# Patient Record
Sex: Male | Born: 1985 | Race: White | Hispanic: No | Marital: Married | Smoking: Former smoker
Health system: Southern US, Community
[De-identification: ages and names within clinical notes are randomized; demographics above are authoritative.]

## PROBLEM LIST (undated history)

## (undated) HISTORY — PX: HERNIA REPAIR: SHX51

## (undated) HISTORY — PX: OTHER SURGICAL HISTORY: SHX169

---

## 2017-09-13 DIAGNOSIS — F4325 Adjustment disorder with mixed disturbance of emotions and conduct: Secondary | ICD-10-CM | POA: Diagnosis not present

## 2017-09-20 DIAGNOSIS — F4325 Adjustment disorder with mixed disturbance of emotions and conduct: Secondary | ICD-10-CM | POA: Diagnosis not present

## 2018-11-11 NOTE — Progress Notes (Signed)
Cardiology Office Note:   Date:  11/12/2018  NAME:  Paul Ellis    MRN: [id number] DOB:  Mar 06, 1985   PCP:  Patient, No Pcp Per  Cardiologist:  Evalina Field, MD   Chief Complaint  Patient presents with   Chest Pain   History of Present Illness:   Paul Ellis is a 33 y.o. male with a hx of family history of CAD who is being seen today for the evaluation of chest pain.  He has no significant past medical history but does report a family history of CAD.  He had a grandfather who died at age 4 of a myocardial infarction.  He reports about 1 month ago, he was awoken from sleep with back pain as well as chest pain.  He reports this pain did improve somewhat with manipulation of his spine.  However, the chest pain was alarming.  He also reports the chest pain was alarming for his wife.  Associated symptoms include difficulty catching his breath when he had the pain.  He denies any fevers chills or other symptoms.  He reports no palpitations.  The pain appears to have started in the back and was radiating into the front of the chest wall.  He reports episodes similar to this in the past.  He reports that the pain in his back was similar in of very worrisome quality.  The pain at that time did go into his jaw, and he was evaluated in the emergency room.  He had an EKG and evaluation at that time was largely unremarkable.  He reports he is relatively sedentary and does not exercise regularly.  He smoked cigarettes in the past but is quit nearly 15 years ago.  He smoked for about 4 years.  He also reports that he does use smokeless tobacco daily.  He has done so for a number of years.  He consumes alcohol occasionally.  His brother has a history of severely high cholesterol, and he takes medications.  He was started on medications for this in his 6s.  His father also has cholesterol issues.  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  His wife is a pulmonologist with [employer], and they have 1  son together.  He takes no medications regularly.   Past Medical History: History reviewed. No pertinent past medical history.  Past Surgical History: Past Surgical History:  Procedure Laterality Date   HERNIA REPAIR     testicular torsion      Current Medications: No outpatient medications have been marked as taking for the 11/12/18 encounter (Office Visit) with Geralynn Rile, MD.     Allergies:    Patient has no known allergies.   Social History: Social History   Socioeconomic History   Marital status: Married    Spouse name: Not on file   Number of children: Not on file   Years of education: Not on file   Highest education level: Not on file  Occupational History   Not on file  Social Needs   Financial resource strain: Not on file   Food insecurity    Worry: Not on file    Inability: Not on file   Transportation needs    Medical: Not on file    Non-medical: Not on file  Tobacco Use   Smoking status: Former Smoker    Years: 8.00   Smokeless tobacco: Never Used  Substance and Sexual Activity   Alcohol use: Not Currently   Drug  use: Never   Sexual activity: Not on file  Lifestyle   Physical activity    Days per week: Not on file    Minutes per session: Not on file   Stress: Not on file  Relationships   Social connections    Talks on phone: Not on file    Gets together: Not on file    Attends religious service: Not on file    Active member of club or organization: Not on file    Attends meetings of clubs or organizations: Not on file    Relationship status: Not on file  Other Topics Concern   Not on file  Social History Narrative   Not on file     Family History: The patient's family history includes Heart attack (age of onset: 27) in his paternal grandfather; Heart disease in his father; Hyperlipidemia in his brother and father.  ROS:   All other ROS reviewed and negative. Pertinent positives noted in the HPI.      EKGs/Labs/Other Studies Reviewed:   The following studies were personally reviewed by me today:  EKG:  EKG is ordered today.  The ekg ordered today demonstrates normal sinus rhythm, heart rate 73, normal intervals, no evidence of ischemia or prior infarction, normal EKG, and was personally reviewed by me.   Recent Labs: No results found for requested labs within last 8760 hours.   Recent Lipid Panel No results found for: CHOL, TRIG, HDL, CHOLHDL, VLDL, LDLCALC, LDLDIRECT  Physical Exam:   VS:  BP 120/83    Pulse 76    Temp (!) 97.5 F (36.4 C)    Ht  (1.626 m)    Wt 149 lb 12.8 oz (67.9 kg)    SpO2 99%    BMI 25.71 kg/m    Wt Readings from Last 3 Encounters:  11/12/18 149 lb 12.8 oz (67.9 kg)    General: Well nourished, well developed, in no acute distress Heart: Atraumatic, normal size  Eyes: PEERLA, EOMI  Neck: Supple, no JVD Endocrine: No thryomegaly Cardiac: Normal S1, S2; RRR; no murmurs, rubs, or gallops Lungs: Clear to auscultation bilaterally, no wheezing, rhonchi or rales  Abd: Soft, nontender, no hepatomegaly  Ext: No edema, pulses 2+ Musculoskeletal: No deformities, BUE and BLE strength normal and equal Skin: Warm and dry, no rashes   Neuro: Alert and oriented to person, place, time, and situation, CNII-XII grossly intact, no focal deficits  Psych: Normal mood and affect   ASSESSMENT:   Paul Ellis is a 33 y.o. male who presents for the following: 1. Chest pain of uncertain etiology   2. Family history of early CAD   3. Precordial pain     PLAN:   1. Chest pain of uncertain etiology 2. Family history of early CAD 3. Precordial pain -He presents with atypical chest pain, that largely started in the back.  I do wonder if this is related to position and musculoskeletal issues.  He does have a significant family history of early coronary artery disease.  His brother also has possibly familial hypercholesterolemia.  His EKG today is very normal  without evidence of ischemia or infarction.  He has had the symptoms intermittently, and they are bothersome for him.  I think it is reasonable proceed with a coronary CTA to exclude obstructive CAD.  I think this will provide further reassurance for him and his wife.  He will take 50 mg metoprolol p.o. 1 hour prior to the scan.  We will  also obtain an echocardiogram to ensure his heart structurally normal.  I do not hear any worrisome murmurs on examination.  I will also check a lipid profile, CBC and CMP 1 week before his CT scan.  We can just do all of the labs together at that time and he will be fasting.  We will see him back in 3 months after this.   Disposition: Return in about 3 months (around 02/12/2019).  Medication Adjustments/Labs and Tests Ordered: Current medicines are reviewed at length with the patient today.  Concerns regarding medicines are outlined above.  Orders Placed This Encounter  Procedures   CT CORONARY MORPH W/CTA COR W/SCORE W/CA W/CM &/OR WO/CM   CT CORONARY FRACTIONAL FLOW RESERVE DATA PREP   CT CORONARY FRACTIONAL FLOW RESERVE FLUID ANALYSIS   Lipid panel   CBC   Comprehensive metabolic panel   EKG 12-Lead   ECHOCARDIOGRAM COMPLETE   Meds ordered this encounter  Medications   metoprolol tartrate (LOPRESSOR) 50 MG tablet    Sig: Take 1 tablet (50 mg total) by mouth once for 1 dose. TAKE TWO HOUR PRIOR TO  SCHEDULE CARDIAC TEST    Dispense:  1 tablet    Refill:  0    Patient Instructions  Medication Instructions:    SEE INSTRUCTION SHEET FOR CCTA  If you need a refill on your cardiac medications before your next appointment, please call your pharmacy*  Lab Work: LIPID, CMP, CBC If you have labs (blood work) drawn today and your tests are completely normal, you will receive your results only by:  MyChart Message (if you have MyChart) OR  A paper copy in the mail If you have any lab test that is abnormal or we need to change your treatment, we  will call you to review the results.  Testing/Procedures: WILL SCHEDULE AT [address] Your physician has requested that you have an echocardiogram. Echocardiography is a painless test that uses sound waves to create images of your heart. It provides your doctor with information about the size and shape of your heart and how well your hearts chambers and valves are working. This procedure takes approximately one hour. There are no restrictions for this procedure.   AND WILL SCHEDULE AT [place] - RADIOLOGY -ONCE APPROVAL IS OBTAINED FROM [facility] Your physician has requested that you have cardiac CTA. Cardiac computed tomography (CT) is a painless test that uses an x-ray machine to take clear, detailed pictures of your heart. For further information please visit [email or web address] Please follow instruction sheet as given.     Follow-Up: At [facility], you and your health needs are our priority.  As part of our continuing mission to provide you with exceptional heart care, we have created designated Provider Care Teams.  These Care Teams include your primary Cardiologist (physician) and Advanced Practice Providers (APPs -  Physician Assistants and Nurse Practitioners) who all work together to provide you with the care you need, when you need it.  Your next appointment:   2 MONTHS  The format for your next appointment:   In Person  Provider:   Lennie Odor, MD  Other Instructions   INSTRUCTIONS FOR  CORONARY CTA   Your cardiac CT will be scheduled at the below locations:   [facility] [address] [place], [place] [address] [phone]    If scheduled at [facility], please arrive at the [facility] main entrance of [facility] 30-45  minutes prior to test start time. Proceed to the [facility] Radiology Department (first floor) to check-in and test prep.     Please follow these instructions carefully  (unless otherwise directed):  PLEASE HAVE LABS DONE IN ONE WEEK PRIOR TO CORONARY CCTA.  On the Night Before the Test:  Be sure to Drink plenty of water.  Do not consume any caffeinated/decaffeinated beverages or chocolate 12 hours prior to your test.  Do not take any antihistamines 12 hours prior to your test.   On the Day of the Test:  Drink plenty of water. Do not drink any water within one hour of the test.  Do not eat any food 4 hours prior to the test.  You may take your regular medications prior to the test.   Take metoprolol (Lopressor)  50  MG  two hours prior to test.        After the Test:  Drink plenty of water.  After receiving IV contrast, you may experience a mild flushed feeling. This is normal.  On occasion, you may experience a mild rash up to 24 hours after the test. This is not dangerous. If this occurs, you can take Benadryl 25 mg and increase your fluid intake.  If you experience trouble breathing, this can be serious. If it is severe call 911 IMMEDIATELY. If it is mild, please call our office.  If you take any of these medications: Glipizide/Metformin, Avandament, Glucavance, please do not take 48 hours after completing test unless otherwise instructed.    Please contact the cardiac imaging nurse navigator should you have any questions/concerns Rockwell AlexandriaSara Wallace, RN Navigator Cardiac Imaging [facility] [phone] Office           Signed, Lenna GilfordWesley T. Flora Lipps'Neal, MD [facility]   [facility]  [address] [place], [place] [address] [phone]  11/12/2018 11:25 AM

## 2018-11-12 ENCOUNTER — Encounter: Payer: Self-pay | Admitting: Cardiovascular Disease

## 2018-11-12 ENCOUNTER — Ambulatory Visit: Payer: BC Managed Care – PPO | Admitting: Cardiovascular Disease

## 2018-11-12 ENCOUNTER — Other Ambulatory Visit: Payer: Self-pay

## 2018-11-12 VITALS — BP 120/83 | HR 76 | Temp 97.5°F | Ht 64.0 in | Wt 149.8 lb

## 2018-11-12 DIAGNOSIS — R079 Chest pain, unspecified: Secondary | ICD-10-CM

## 2018-11-12 DIAGNOSIS — Z8249 Family history of ischemic heart disease and other diseases of the circulatory system: Secondary | ICD-10-CM | POA: Diagnosis not present

## 2018-11-12 DIAGNOSIS — R072 Precordial pain: Secondary | ICD-10-CM | POA: Diagnosis not present

## 2018-11-12 MED ORDER — METOPROLOL TARTRATE 50 MG PO TABS: 50.0000 mg | ORAL_TABLET | Freq: Once | ORAL | 0 refills | Status: AC

## 2018-11-12 NOTE — Patient Instructions (Addendum)
Medication Instructions:    SEE INSTRUCTION SHEET FOR CCTA  If you need a refill on your cardiac medications before your next appointment, please call your pharmacy*  Lab Work: LIPID, CMP, CBC If you have labs (blood work) drawn today and your tests are completely normal, you will receive your results only by: Marland Kitchen MyChart Message (if you have MyChart) OR . A paper copy in the mail If you have any lab test that is abnormal or we need to change your treatment, we will call you to review the results.  Testing/Procedures: WILL SCHEDULE AT [place] Your physician has requested that you have an echocardiogram. Echocardiography is a painless test that uses sound waves to create images of your heart. It provides your doctor with information about the size and shape of your heart and how well your heart's chambers and valves are working. This procedure takes approximately one hour. There are no restrictions for this procedure.   AND WILL SCHEDULE AT [place] FROM [facility] Your physician has requested that you have cardiac CTA. Cardiac computed tomography (CT) is a painless test that uses an x-ray machine to take clear, detailed pictures of your heart. For further information please visit [email or web address]. Please follow instruction sheet as given.     Follow-Up: At [facility], you and your health needs are our priority.  As part of our continuing mission to provide you with exceptional heart care, we have created designated Provider Care Teams.  These Care Teams include your primary Cardiologist (physician) and Advanced Practice Providers (APPs -  Physician Assistants and Nurse Practitioners) who all work together to provide you with the care you need, when you need it.  Your next appointment:   2 MONTHS  The format for your next appointment:   In Person  Provider:   Eleonore Chiquito, MD  Other  Instructions   INSTRUCTIONS FOR  CORONARY CTA   Your cardiac CT will be scheduled at the below locations:   [facility] [address] [place], [place] [address] [phone]    If scheduled at [facility], please arrive at the [facility] main entrance of [facility] 30-45 minutes prior to test start time. Proceed to the [facility] Radiology Department (first floor) to check-in and test prep.     Please follow these instructions carefully (unless otherwise directed):  PLEASE HAVE LABS DONE IN ONE WEEK PRIOR TO CORONARY CCTA.  On the Night Before the Test: . Be sure to Drink plenty of water. . Do not consume any caffeinated/decaffeinated beverages or chocolate 12 hours prior to your test. . Do not take any antihistamines 12 hours prior to your test.   On the Day of the Test: . Drink plenty of water. Do not drink any water within one hour of the test. . Do not eat any food 4 hours prior to the test. . You may take your regular medications prior to the test.  . Take metoprolol (Lopressor)  50  MG  two hours prior to test.        After the Test: . Drink plenty of water. . After receiving IV contrast, you may experience a mild flushed feeling. This is normal. . On occasion, you may experience a mild rash up to 24 hours after the test. This is not dangerous. If this occurs, you can take Benadryl 25 mg and increase your fluid intake. . If you experience  trouble breathing, this can be serious. If it is severe call 911 IMMEDIATELY. If it is mild, please call our office. . If you take any of these medications: Glipizide/Metformin, Avandament, Glucavance, please do not take 48 hours after completing test unless otherwise instructed.    Please contact the cardiac imaging nurse navigator should you have any questions/concerns Rockwell Alexandria, RN Navigator Cardiac Imaging [facility] Heart and Vascular Services [phone] Office

## 2018-11-14 ENCOUNTER — Ambulatory Visit (HOSPITAL_COMMUNITY): Payer: BC Managed Care – PPO | Attending: Cardiology

## 2018-11-14 ENCOUNTER — Other Ambulatory Visit: Payer: Self-pay

## 2018-11-14 DIAGNOSIS — R079 Chest pain, unspecified: Secondary | ICD-10-CM | POA: Insufficient documentation

## 2018-11-14 DIAGNOSIS — Z8249 Family history of ischemic heart disease and other diseases of the circulatory system: Secondary | ICD-10-CM | POA: Diagnosis not present

## 2018-12-11 ENCOUNTER — Telehealth (HOSPITAL_COMMUNITY): Payer: Self-pay | Admitting: Emergency Medicine

## 2018-12-11 NOTE — Telephone Encounter (Signed)
Reaching out to patient to offer assistance regarding upcoming cardiac imaging study; pt verbalizes understanding of appt date/time, parking situation and where to check in, pre-test NPO status and medications ordered, and verified current allergies; name and call back number provided for further questions should they arise Keithan Dileonardo RN Navigator Cardiac Imaging [facility] [phone] office [phone] cell 

## 2018-12-12 ENCOUNTER — Ambulatory Visit (HOSPITAL_COMMUNITY)
Admission: RE | Admit: 2018-12-12 | Discharge: 2018-12-12 | Disposition: A | Discharge status: Home / Self Care | Payer: BC Managed Care – PPO | Source: Ambulatory Visit | Attending: Cardiovascular Disease | Admitting: Cardiovascular Disease

## 2018-12-12 ENCOUNTER — Encounter: Payer: Self-pay | Admitting: *Deleted

## 2018-12-12 ENCOUNTER — Other Ambulatory Visit: Payer: Self-pay

## 2018-12-12 DIAGNOSIS — R072 Precordial pain: Secondary | ICD-10-CM | POA: Insufficient documentation

## 2018-12-12 DIAGNOSIS — Z8249 Family history of ischemic heart disease and other diseases of the circulatory system: Secondary | ICD-10-CM | POA: Insufficient documentation

## 2018-12-12 DIAGNOSIS — Z006 Encounter for examination for normal comparison and control in clinical research program: Secondary | ICD-10-CM

## 2018-12-12 DIAGNOSIS — R079 Chest pain, unspecified: Secondary | ICD-10-CM | POA: Diagnosis not present

## 2018-12-12 LAB — CBC
HCT: 45.7 % (ref 39.0–52.0)
Hemoglobin: 15.2 g/dL (ref 13.0–17.0)
MCH: 30 pg (ref 26.0–34.0)
MCHC: 33.3 g/dL (ref 30.0–36.0)
MCV: 90.3 fL (ref 80.0–100.0)
Platelets: 276 10*3/uL (ref 150–400)
RBC: 5.06 MIL/uL (ref 4.22–5.81)
RDW: 12.9 % (ref 11.5–15.5)
WBC: 8.4 10*3/uL (ref 4.0–10.5)
nRBC: 0 % (ref 0.0–0.2)

## 2018-12-12 LAB — COMPREHENSIVE METABOLIC PANEL
ALT: 26 U/L (ref 0–44)
AST: 18 U/L (ref 15–41)
Albumin: 4.2 g/dL (ref 3.5–5.0)
Alkaline Phosphatase: 66 U/L (ref 38–126)
Anion gap: 9 (ref 5–15)
BUN: 11 mg/dL (ref 6–20)
CO2: 26 mmol/L (ref 22–32)
Calcium: 9.3 mg/dL (ref 8.9–10.3)
Chloride: 102 mmol/L (ref 98–111)
Creatinine, Ser: 1.18 mg/dL (ref 0.61–1.24)
GFR calc Af Amer: >60 mL/min (ref >60)
GFR calc non Af Amer: >60 mL/min (ref >60)
Glucose, Bld: 94 mg/dL (ref 70–99)
Potassium: 3.7 mmol/L (ref 3.5–5.1)
Sodium: 137 mmol/L (ref 135–145)
Total Bilirubin: 0.6 mg/dL (ref 0.3–1.2)
Total Protein: 6.8 g/dL (ref 6.5–8.1)

## 2018-12-12 LAB — LIPID PANEL
Cholesterol: 221 mg/dL — ABNORMAL HIGH (ref 0–200)
HDL: 33 mg/dL — ABNORMAL LOW (ref >40)
LDL Cholesterol: 128 mg/dL — ABNORMAL HIGH (ref 0–99)
Total CHOL/HDL Ratio: 6.7 RATIO
Triglycerides: 298 mg/dL — ABNORMAL HIGH (ref <150)
VLDL: 60 mg/dL — ABNORMAL HIGH (ref 0–40)

## 2018-12-12 LAB — POCT I-STAT CREATININE: Creatinine, Ser: 1.1 mg/dL (ref 0.61–1.24)

## 2018-12-12 IMAGING — CT CT HEART MORP W/ CTA COR W/ SCORE W/ CA W/CM &/OR W/O CM
4 of 7 series · 8 of 20 positions shown, 9 images · non-contrast
Comparison: None.
COMPARISON: None.

Addendum:
EXAM:
OVER-READ INTERPRETATION  CT CHEST

The following report is an over-read performed by radiologist Dr.
over-read does not include interpretation of cardiac or coronary
anatomy or pathology. The coronary CTA interpretation by the
cardiologist is attached.
CLINICAL DATA: 33-year-old male with atypical chest pain.
Cardiac/Coronary  CTA
TECHNIQUE: The patient was scanned on a Phillips Force scanner.

[Series 6: best diast 75 % · axial · 0.39mm/px · z∈[-158,-119]mm · 2 of 298 slices shown]
[im 100/298  vessel]
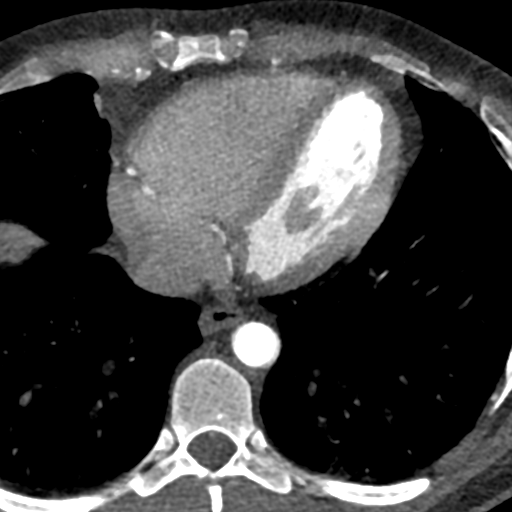
[im 199/298  vessel]
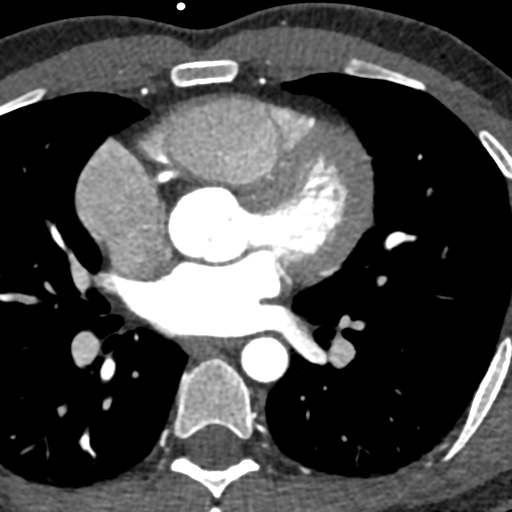

[Series 7: best syst · axial · 0.39mm/px · z∈[-158,-119]mm · 2 of 298 slices shown, 3 images]
[im 100/298  vessel]
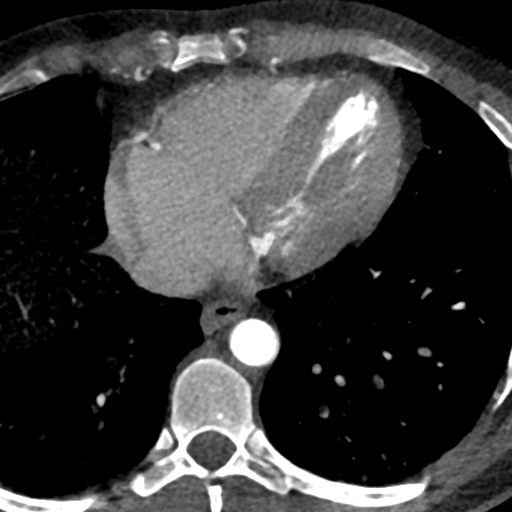
[im 100/298  lung]
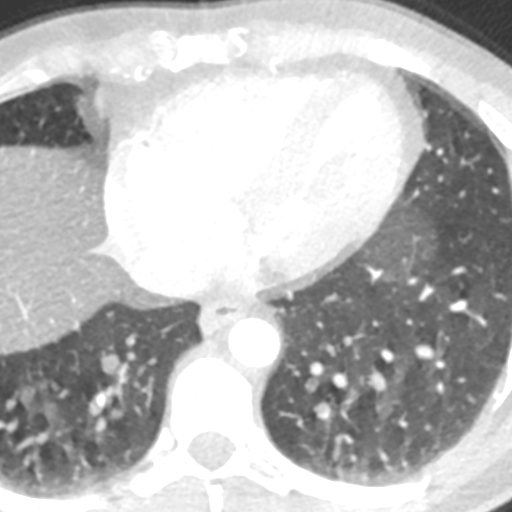
[im 199/298  vessel]
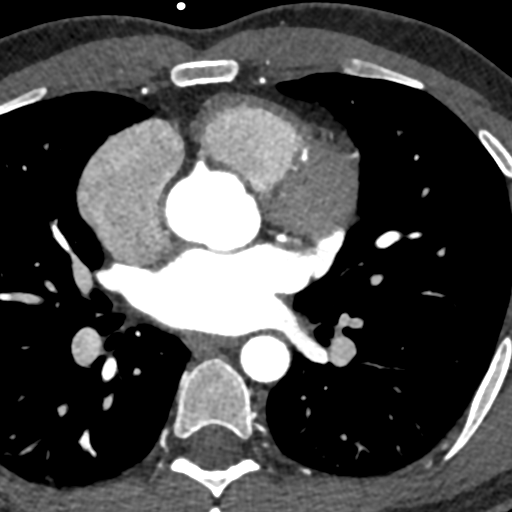

[Series 8: ts diast sharp 36 % · axial · 0.39mm/px · z∈[-158,-119]mm · 2 of 298 slices shown]
[im 100/298  lung]
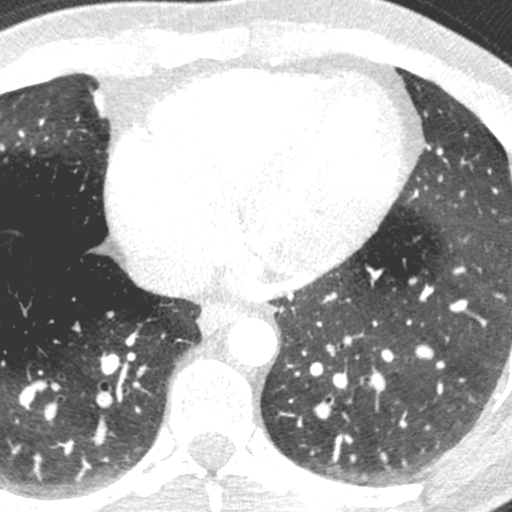
[im 199/298  lung]
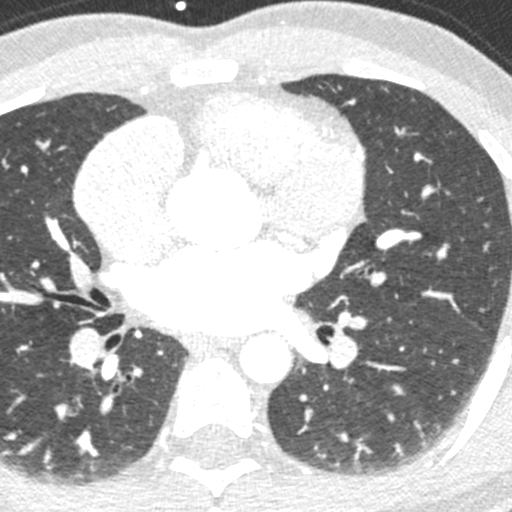

[Series 9: ts syst sharp · axial · 0.39mm/px · z∈[-158,-119]mm · 2 of 298 slices shown]
[im 100/298  lung]
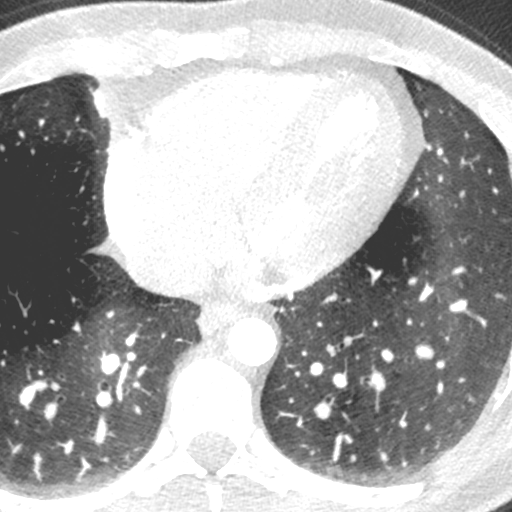
[im 199/298  lung]
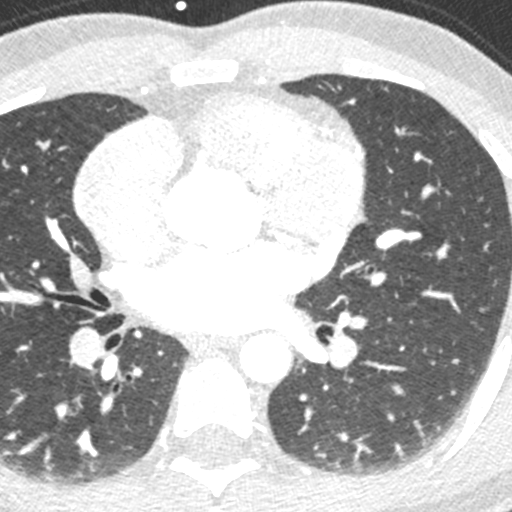

[8 of 20 positions shown; findings below may reference images not displayed]

FINDINGS: Vascular: Normal aortic caliber. No central pulmonary embolism, on
this non-dedicated study.

Mediastinum/Nodes: No imaged thoracic adenopathy.

Lungs/Pleura: No imaged pleural fluid.  Clear imaged lungs.

Upper Abdomen: Normal imaged portions of the liver, spleen, stomach.

Musculoskeletal: No acute osseous abnormality.
IMPRESSION: No acute findings in the imaged extracardiac chest.
FINDINGS: A 100 kV prospective scan was triggered in the descending thoracic
aorta at 111 HU's. Axial non-contrast 3 mm slices were carried out
through the heart. The data set was analyzed on a dedicated work
station and scored using the Agatson method. Gantry rotation speed
was 250 msecs and collimation was .6 mm. 50 mg of PO Metoprolol and
0.8 mg of sl NTG was given. The 3D data set was reconstructed in 5%
intervals of the 67-82 % of the R-R cycle. Diastolic phases were
analyzed on a dedicated work station using MPR, MIP and VRT modes.
The patient received 80 cc of contrast.

Aorta:  Normal size.  No calcifications.  No dissection.

Aortic Valve:  Trileaflet.  No calcifications.

Coronary Arteries:  Normal coronary origin.  Right dominance.

RCA is a large dominant artery that gives rise to PDA and PLA. There
is no plaque.

Left main is a large artery that gives rise to LAD and LCX arteries.
Left main has no plaque.

LAD is a large vessel that gives rise to a large diagonal artery and
has no plaque.

LCX is a non-dominant artery that gives rise to one large OM1
branch. There is no plaque.

Other findings:

Normal pulmonary vein drainage into the left atrium.

Normal left atrial appendage without a thrombus.

Normal size of the pulmonary artery.
IMPRESSION: 1. Coronary calcium score of 0. This was 0 percentile for age and
sex matched control.

2. Normal coronary origin with right dominance.

3. This study is affected by motion, however there is no CAD.
CAD-RADS 0. Consider non-atherosclerotic causes of chest pain.

*** End of Addendum ***
EXAM:
OVER-READ INTERPRETATION  CT CHEST

The following report is an over-read performed by radiologist Dr.
over-read does not include interpretation of cardiac or coronary
anatomy or pathology. The coronary CTA interpretation by the
cardiologist is attached.
FINDINGS: Vascular: Normal aortic caliber. No central pulmonary embolism, on
this non-dedicated study.

Mediastinum/Nodes: No imaged thoracic adenopathy.

Lungs/Pleura: No imaged pleural fluid.  Clear imaged lungs.

Upper Abdomen: Normal imaged portions of the liver, spleen, stomach.

Musculoskeletal: No acute osseous abnormality.
IMPRESSION: No acute findings in the imaged extracardiac chest.

## 2018-12-12 MED ORDER — NITROGLYCERIN 0.4 MG SL SUBL: 0.8000 mg | SUBLINGUAL_TABLET | SUBLINGUAL | Status: DC | PRN

## 2018-12-12 MED ORDER — METOPROLOL TARTRATE 5 MG/5ML IV SOLN: 5.0000 mg | INTRAVENOUS | Status: DC | PRN

## 2018-12-12 MED ORDER — METOPROLOL TARTRATE 5 MG/5ML IV SOLN
INTRAVENOUS | Status: AC
  Filled 2018-12-12: qty 15

## 2018-12-12 MED ORDER — NITROGLYCERIN 0.4 MG SL SUBL
SUBLINGUAL_TABLET | SUBLINGUAL | Status: AC
  Filled 2018-12-12: qty 1

## 2018-12-12 MED ORDER — IOHEXOL 350 MG/ML SOLN
80.0000 mL | Freq: Once | INTRAVENOUS | Status: AC | PRN
  Administered 2018-12-12: 80 mL via INTRAVENOUS

## 2018-12-15 ENCOUNTER — Telehealth: Payer: Self-pay | Admitting: Cardiovascular Disease

## 2018-12-15 NOTE — Telephone Encounter (Signed)
Called and notified Mr. Francisco for normal cardiac CTA. Discussed lifestyle modification for cholesterol. Will plan to recheck in a few months.   [place] T. Audie Box, [place]  [address] [place], [place] [address] [phone]  11:49 AM

## 2018-12-22 NOTE — Research (Signed)
CADFEM Informed Consent                  Subject Name:   Race P. Sheetz   Subject met inclusion and exclusion criteria.  The informed consent form, study requirements and expectations were reviewed with the subject and questions and concerns were addressed prior to the signing of the consent form.  The subject verbalized understanding of the trial requirements.  The subject agreed to participate in the CADFEM trial and signed the informed consent.  The informed consent was obtained prior to performance of any protocol-specific procedures for the subject.  A copy of the signed informed consent was given to the subject and a copy was placed in the subject's medical record.   Kenya Chalmers, Research Assistant 12/12/2018 14:38  P.m. 

## 2018-12-29 ENCOUNTER — Encounter (HOSPITAL_COMMUNITY): Payer: Self-pay

## 2018-12-29 ENCOUNTER — Other Ambulatory Visit: Payer: Self-pay

## 2018-12-29 ENCOUNTER — Ambulatory Visit (HOSPITAL_COMMUNITY)
Admission: EM | Admit: 2018-12-29 | Discharge: 2018-12-29 | Disposition: A | Discharge status: Home / Self Care | Payer: BC Managed Care – PPO | Attending: Family Medicine | Admitting: Family Medicine

## 2018-12-29 DIAGNOSIS — Z7251 High risk heterosexual behavior: Secondary | ICD-10-CM | POA: Insufficient documentation

## 2018-12-29 DIAGNOSIS — R369 Urethral discharge, unspecified: Secondary | ICD-10-CM | POA: Diagnosis not present

## 2018-12-29 LAB — POCT URINALYSIS DIP (DEVICE)
Bilirubin Urine: NEGATIVE
Glucose, UA: NEGATIVE mg/dL
Ketones, ur: NEGATIVE mg/dL
Nitrite: NEGATIVE
Protein, ur: NEGATIVE mg/dL
Specific Gravity, Urine: 1.02 (ref 1.005–1.030)
Urobilinogen, UA: 0.2 mg/dL (ref 0.0–1.0)
pH: 7 (ref 5.0–8.0)

## 2018-12-29 MED ORDER — AZITHROMYCIN 250 MG PO TABS
ORAL_TABLET | ORAL | Status: AC
  Filled 2018-12-29: qty 4

## 2018-12-29 MED ORDER — CEFTRIAXONE SODIUM 250 MG IJ SOLR
250.0000 mg | Freq: Once | INTRAMUSCULAR | Status: AC
  Administered 2018-12-29: 250 mg via INTRAMUSCULAR

## 2018-12-29 MED ORDER — AZITHROMYCIN 250 MG PO TABS
1000.0000 mg | ORAL_TABLET | Freq: Once | ORAL | Status: AC
  Administered 2018-12-29: 1000 mg via ORAL

## 2018-12-29 MED ORDER — CEFTRIAXONE SODIUM 250 MG IJ SOLR
INTRAMUSCULAR | Status: AC
  Filled 2018-12-29: qty 250

## 2018-12-29 NOTE — ED Triage Notes (Signed)
Pt. States he has had his symptoms for 6 days, itching , burning, discharge. States it could be a STD.

## 2018-12-29 NOTE — Discharge Instructions (Signed)

## 2018-12-29 NOTE — ED Provider Notes (Signed)
[facility]   [id number] 12/29/18 Arrival Time: 0849  ASSESSMENT & PLAN:  1. Penile discharge     Declines HIV/RPR testing.   Discharge Instructions     You have been given the following medications today for treatment of suspected gonorrhea and/or chlamydia:  cefTRIAXone (ROCEPHIN) injection 250 mg azithromycin (ZITHROMAX) tablet 1,000 mg  Even though we have treated you today, we have sent testing for sexually transmitted infections. We will notify you of any positive results once they are received. If required, we will prescribe any medications you might need.  Please refrain from all sexual activity for at least the next seven days.     Pending: Labs Reviewed  CYTOLOGY, (ORAL, ANAL, URETHRAL) ANCILLARY ONLY    Will notify of any positive results. Instructed to refrain from sexual activity for at least seven days.  Reviewed expectations re: course of current medical issues. Questions answered. Outlined signs and symptoms indicating need for more acute intervention. Patient verbalized understanding. After Visit Summary given.   SUBJECTIVE:  Paul Ellis is a 33 y.o. male who presents with complaint of penile discharge. Onset gradual. First noticed 5-6 d ago. Describes discharge as thick and light yellow. Denies: urinary frequency; questions mild burning. Afebrile. No abdominal or pelvic pain. No n/v. No rashes or lesions. Reports that he is sexually active with multiple male partners. OTC treatment: none reported. History of STI: none reported.  ROS: As per HPI. All other systems negative.   OBJECTIVE:  Vitals:   12/29/18 0927  BP: 130/79  Pulse: 83  Resp: 18  Temp: 98.9 F (37.2 C)  TempSrc: Oral  SpO2: 97%  Weight: 63.5 kg     General appearance: alert, cooperative, appears stated age and no distress Throat: lips, mucosa, and tongue normal; teeth and gums normal CV: RRR Lungs: CTAB; unlabored respirations Back: no CVA  tenderness; FROM at waist Abdomen: soft, non-tender GU: deferred Skin: warm and dry Neuro: normal gait; moving all extremities normally Psychological: alert and cooperative; normal mood and affect.    Labs Reviewed  CYTOLOGY, (ORAL, ANAL, URETHRAL) ANCILLARY ONLY    No Known Allergies  PMH: "Healthy".  Family History  Problem Relation Age of Onset  . Heart disease Father   . Hyperlipidemia Father   . Hyperlipidemia Brother   . Heart attack Paternal Grandfather 77       died of this  . Healthy Mother    Social History   Socioeconomic History  . Marital status: Married    Spouse name: Not on file  . Number of children: Not on file  . Years of education: Not on file  . Highest education level: Not on file  Occupational History  . Not on file  Social Needs  . Financial resource strain: Not on file  . Food insecurity    Worry: Not on file    Inability: Not on file  . Transportation needs    Medical: Not on file    Non-medical: Not on file  Tobacco Use  . Smoking status: Former Smoker    Years: 8.00  . Smokeless tobacco: Never Used  Substance and Sexual Activity  . Alcohol use: Not Currently  . Drug use: Never  . Sexual activity: Not on file  Lifestyle  . Physical activity    Days per week: Not on file    Minutes per session: Not on file  . Stress: Not on file  Relationships  . Social Musician on phone:  Not on file    Gets together: Not on file    Attends religious service: Not on file    Active member of club or organization: Not on file    Attends meetings of clubs or organizations: Not on file    Relationship status: Not on file  . Intimate partner violence    Fear of current or ex partner: Not on file    Emotionally abused: Not on file    Physically abused: Not on file    Forced sexual activity: Not on file  Other Topics Concern  . Not on file  Social History Narrative  . Not on file          Vanessa Kick, MD 12/29/18 [phone]

## 2018-12-30 LAB — CYTOLOGY, (ORAL, ANAL, URETHRAL) ANCILLARY ONLY
Chlamydia: NEGATIVE
Neisseria Gonorrhea: POSITIVE — AB
Trichomonas: NEGATIVE

## 2018-12-31 ENCOUNTER — Telehealth (HOSPITAL_COMMUNITY): Payer: Self-pay | Admitting: Emergency Medicine

## 2018-12-31 NOTE — Telephone Encounter (Signed)
Test for gonorrhea was positive. This was treated at the urgent care visit with IM rocephin 250mg and po zithromax 1g. Pt needs education to refrain from sexual intercourse for 7 days after treatment to give the medicine time to work. Sexual partners need to be notified and tested/treated. Condoms may reduce risk of reinfection. Recheck or followup with PCP for further evaluation if symptoms are not improving. [facility] notified.  Patient contacted by phone and made aware of    results. Pt verbalized understanding and had all questions answered.    

## 2019-02-13 ENCOUNTER — Ambulatory Visit: Payer: BC Managed Care – PPO | Admitting: Cardiovascular Disease

## 2019-02-17 DIAGNOSIS — Z03818 Encounter for observation for suspected exposure to other biological agents ruled out: Secondary | ICD-10-CM | POA: Diagnosis not present

## 2019-02-17 DIAGNOSIS — Z20828 Contact with and (suspected) exposure to other viral communicable diseases: Secondary | ICD-10-CM | POA: Diagnosis not present

## 2019-12-16 DIAGNOSIS — F4323 Adjustment disorder with mixed anxiety and depressed mood: Secondary | ICD-10-CM | POA: Diagnosis not present

## 2020-02-09 DIAGNOSIS — F4323 Adjustment disorder with mixed anxiety and depressed mood: Secondary | ICD-10-CM | POA: Diagnosis not present

## 2020-02-16 DIAGNOSIS — F4323 Adjustment disorder with mixed anxiety and depressed mood: Secondary | ICD-10-CM | POA: Diagnosis not present

## 2020-02-17 DIAGNOSIS — R0782 Intercostal pain: Secondary | ICD-10-CM | POA: Diagnosis not present

## 2020-02-17 DIAGNOSIS — M9901 Segmental and somatic dysfunction of cervical region: Secondary | ICD-10-CM | POA: Diagnosis not present

## 2020-02-17 DIAGNOSIS — M9902 Segmental and somatic dysfunction of thoracic region: Secondary | ICD-10-CM | POA: Diagnosis not present

## 2020-02-17 DIAGNOSIS — M7542 Impingement syndrome of left shoulder: Secondary | ICD-10-CM | POA: Diagnosis not present

## 2020-03-01 DIAGNOSIS — F4323 Adjustment disorder with mixed anxiety and depressed mood: Secondary | ICD-10-CM | POA: Diagnosis not present

## 2020-05-30 DIAGNOSIS — Z20822 Contact with and (suspected) exposure to covid-19: Secondary | ICD-10-CM | POA: Diagnosis not present

## 2020-06-08 DIAGNOSIS — F4323 Adjustment disorder with mixed anxiety and depressed mood: Secondary | ICD-10-CM | POA: Diagnosis not present

## 2020-06-09 DIAGNOSIS — F4323 Adjustment disorder with mixed anxiety and depressed mood: Secondary | ICD-10-CM | POA: Diagnosis not present

## 2020-11-23 DIAGNOSIS — J343 Hypertrophy of nasal turbinates: Secondary | ICD-10-CM | POA: Diagnosis not present

## 2020-11-23 DIAGNOSIS — Z87891 Personal history of nicotine dependence: Secondary | ICD-10-CM | POA: Diagnosis not present

## 2020-11-23 DIAGNOSIS — J3501 Chronic tonsillitis: Secondary | ICD-10-CM | POA: Diagnosis not present

## 2021-01-02 DIAGNOSIS — J3501 Chronic tonsillitis: Secondary | ICD-10-CM | POA: Diagnosis not present
